# Patient Record
Sex: Female | Born: 1965 | Marital: Married | Smoking: Former smoker
Health system: Southern US, Community
[De-identification: ages and names within clinical notes are randomized; demographics above are authoritative.]

## PROBLEM LIST (undated history)

## (undated) DIAGNOSIS — N6001 Solitary cyst of right breast: Secondary | ICD-10-CM

## (undated) DIAGNOSIS — I10 Essential (primary) hypertension: Secondary | ICD-10-CM

## (undated) DIAGNOSIS — E785 Hyperlipidemia, unspecified: Secondary | ICD-10-CM

## (undated) DIAGNOSIS — G5603 Carpal tunnel syndrome, bilateral upper limbs: Secondary | ICD-10-CM

## (undated) DIAGNOSIS — G43909 Migraine, unspecified, not intractable, without status migrainosus: Secondary | ICD-10-CM

## (undated) HISTORY — PX: BREAST BIOPSY: SHX20

## (undated) HISTORY — DX: Carpal tunnel syndrome, bilateral upper limbs: G56.03

## (undated) HISTORY — DX: Solitary cyst of right breast: N60.01

## (undated) HISTORY — DX: Essential (primary) hypertension: I10

## (undated) HISTORY — PX: OTHER SURGICAL HISTORY: SHX169

## (undated) HISTORY — DX: Migraine, unspecified, not intractable, without status migrainosus: G43.909

## (undated) HISTORY — PX: APPENDECTOMY: SHX54

## (undated) HISTORY — DX: Hyperlipidemia, unspecified: E78.5

## (undated) HISTORY — PX: HIP SURGERY: SHX245

---

## 2020-10-17 ENCOUNTER — Other Ambulatory Visit: Payer: Self-pay | Admitting: Gerontology

## 2020-10-26 ENCOUNTER — Other Ambulatory Visit: Payer: Self-pay | Admitting: Gerontology

## 2020-10-26 DIAGNOSIS — N6310 Unspecified lump in the right breast, unspecified quadrant: Secondary | ICD-10-CM

## 2020-10-31 ENCOUNTER — Other Ambulatory Visit: Payer: Self-pay | Admitting: Gerontology

## 2020-10-31 ENCOUNTER — Telehealth: Payer: Self-pay | Admitting: Surgical

## 2020-10-31 DIAGNOSIS — N6310 Unspecified lump in the right breast, unspecified quadrant: Secondary | ICD-10-CM

## 2020-10-31 NOTE — Telephone Encounter (Signed)
LM for patient to return call. Patient has referral for Colposcopy. We do not have any records for this patient. We are needing a pap result.

## 2020-11-01 ENCOUNTER — Encounter: Payer: Self-pay | Admitting: Obstetrics and Gynecology

## 2020-11-01 ENCOUNTER — Other Ambulatory Visit: Payer: Self-pay

## 2020-11-01 ENCOUNTER — Other Ambulatory Visit (HOSPITAL_COMMUNITY)
Admission: RE | Admit: 2020-11-01 | Discharge: 2020-11-01 | Disposition: A | Discharge status: Home / Self Care | Payer: PRIVATE HEALTH INSURANCE | Source: Ambulatory Visit | Attending: Obstetrics and Gynecology | Admitting: Obstetrics and Gynecology

## 2020-11-01 ENCOUNTER — Ambulatory Visit (INDEPENDENT_AMBULATORY_CARE_PROVIDER_SITE_OTHER): Payer: No Typology Code available for payment source | Admitting: Obstetrics and Gynecology

## 2020-11-01 VITALS — BP 127/78 | HR 82 | Ht 64.0 in | Wt 276.0 lb

## 2020-11-01 DIAGNOSIS — Z8742 Personal history of other diseases of the female genital tract: Secondary | ICD-10-CM

## 2020-11-01 NOTE — Addendum Note (Signed)
Addended by: Silvano Bilis on: 11/01/2020 09:25 AM   Modules accepted: Orders

## 2020-11-01 NOTE — Progress Notes (Signed)
HPI:      Sheena Johnson is a 54 y.o. No obstetric history on file. who LMP was Patient's last menstrual period was 10/20/2020 (approximate).  Subjective:   [name]  Her last appointment in [place] she was told that she had a cervical abnormality that required colposcopy.  Patient does not have copies of her Pap smear from [place] and has been unable to get them despite signing a record release.  She is requesting examination of her cervix and possibly "starting over" with a new Pap smear to determine if colposcopy is actually necessary. She reports no abnormal history of Pap smears in her past.  She states no one has ever told her she had positive HPV. Patient is a former smoker who has not smoked in many years. She reports her period  as irregular and says that she has been having hot flashes.   She is specifically here today to discuss her cervical issues.   Hx: The following portions of the patient's history were reviewed and updated as appropriate:             She  has a past medical history of Carpal tunnel syndrome on both sides, Cyst (solitary) of breast, right, Hyperlipidemia, Hypertension, and Migraine. She does not have a problem list on file. She  has a past surgical history that includes left big toe; Hip surgery; Appendectomy; and Cesarean section. Her family history includes Breast cancer in her maternal aunt and maternal grandmother; Hypertension in her father. She  reports that she quit smoking about 8 years ago. She has never used smokeless tobacco. She reports current alcohol use. She reports that she does not use drugs. She has a current medication list which includes the following prescription(s): aspirin, cholecalciferol, eletriptan, hydrochlorothiazide, lovastatin, multi-vitamin, tamoxifen, and venlafaxine xr. She has No Known Allergies.       Review of Systems:  Review of Systems  Constitutional: Denied  constitutional symptoms, night sweats, recent illness, fatigue, fever, insomnia and weight loss.  Eyes: Denied eye symptoms, eye pain, photophobia, vision change and visual disturbance.  Ears/Nose/Throat/Neck: Denied ear, nose, throat or neck symptoms, hearing loss, nasal discharge, sinus congestion and sore throat.  Cardiovascular: Denied cardiovascular symptoms, arrhythmia, chest pain/pressure, edema, exercise intolerance, orthopnea and palpitations.  Respiratory: Denied pulmonary symptoms, asthma, pleuritic pain, productive sputum, cough, dyspnea and wheezing.  Gastrointestinal: Denied, gastro-esophageal reflux, melena, nausea and vomiting.  Genitourinary: See HPI for additional information.  Musculoskeletal: Denied musculoskeletal symptoms, stiffness, swelling, muscle weakness and myalgia.  Dermatologic: Denied dermatology symptoms, rash and scar.  Neurologic: Denied neurology symptoms, dizziness, headache, neck pain and syncope.  Psychiatric: Denied psychiatric symptoms, anxiety and depression.  Endocrine: Denied endocrine symptoms including hot flashes and night sweats.   Meds:   Current Outpatient Medications on File Prior to Visit  Medication Sig Dispense Refill  . aspirin 81 MG EC tablet Take by mouth.    . Cholecalciferol 25 MCG (1000 UT) tablet Take by mouth.    . eletriptan (RELPAX) 40 MG tablet Take by mouth.    . hydrochlorothiazide (HYDRODIURIL) 25 MG tablet Take by mouth.    . lovastatin (MEVACOR) 20 MG tablet Take by mouth.    . Multiple Vitamin (MULTI-VITAMIN) tablet Take 1 tablet by mouth daily.    . tamoxifen (NOLVADEX) 20 MG tablet Take by mouth.    . venlafaxine XR (EFFEXOR-XR) 150 MG 24 hr capsule Take by mouth.     No  current facility-administered medications on file prior to visit.          Objective:     Vitals:   11/01/20 0809  BP: 127/78  Pulse: 82   Filed Weights   11/01/20 0809  Weight: 276 lb (125.2 kg)              Physical examination    Pelvic:  Vulva: Normal appearance.  No lesions.  Vagina: No lesions or abnormalities noted.  Support: Normal pelvic support.  Urethra No masses tenderness or scarring.  Meatus Normal size without lesions or prolapse.  Cervix: Normal appearance.  No lesions.  Anus: Normal exam.  No lesions.  Perineum: Normal exam.  No lesions.   Pap smear performed.  Endocervix somewhat friable with some bleeding after Pap noted. No polyps or other cervical abnormalities noted.  Assessment:    No obstetric history on file. There are no problems to display for this patient.    1. History of abnormal cervical Pap smear     Unknown what this abnormality is.  Nothing noted today on speculum exam.   Plan:            1.  Pap cotest performed.  When results return we can decide future management if necessary.  2.  Briefly discussed menopause and skipping menstrual periods as well as hot flashes. Orders No orders of the defined types were placed in this encounter.   No orders of the defined types were placed in this encounter.     F/U  Return for We will contact her with any abnormal test results. I spent 31 minutes involved in the care of this patient preparing to see the patient by obtaining and reviewing her medical history (including labs, imaging tests and prior procedures), documenting clinical information in the electronic health record (EHR), counseling and coordinating care plans, writing and sending prescriptions, ordering tests or procedures and directly communicating with the patient by discussing pertinent items from her history and physical exam as well as detailing my assessment and plan as noted above so that she has an informed understanding.  All of her questions were answered.  Elonda Husky, M.D. 11/01/2020 8:57 AM

## 2020-11-01 NOTE — Addendum Note (Signed)
Addended by: Blair Heys on: 11/01/2020 09:46 AM   Modules accepted: Orders

## 2020-11-02 LAB — CYTOLOGY - PAP
Comment: NEGATIVE
Diagnosis: NEGATIVE
High risk HPV: NEGATIVE

## 2020-11-21 ENCOUNTER — Other Ambulatory Visit: Payer: Self-pay

## 2020-11-24 ENCOUNTER — Other Ambulatory Visit: Payer: Self-pay

## 2020-11-24 ENCOUNTER — Ambulatory Visit
Admission: RE | Admit: 2020-11-24 | Discharge: 2020-11-24 | Disposition: A | Discharge status: Home / Self Care | Payer: No Typology Code available for payment source | Source: Ambulatory Visit | Attending: Gerontology | Admitting: Gerontology

## 2020-11-24 DIAGNOSIS — N6310 Unspecified lump in the right breast, unspecified quadrant: Secondary | ICD-10-CM

## 2020-12-04 ENCOUNTER — Other Ambulatory Visit: Payer: Self-pay

## 2020-12-04 ENCOUNTER — Ambulatory Visit (INDEPENDENT_AMBULATORY_CARE_PROVIDER_SITE_OTHER): Payer: PRIVATE HEALTH INSURANCE

## 2020-12-04 ENCOUNTER — Encounter: Payer: Self-pay | Admitting: Emergency Medicine

## 2020-12-04 ENCOUNTER — Ambulatory Visit
Admission: EM | Admit: 2020-12-04 | Discharge: 2020-12-04 | Disposition: A | Discharge status: Home / Self Care | Payer: PRIVATE HEALTH INSURANCE | Attending: Sports Medicine | Admitting: Sports Medicine

## 2020-12-04 DIAGNOSIS — M7989 Other specified soft tissue disorders: Secondary | ICD-10-CM

## 2020-12-04 DIAGNOSIS — S8002XA Contusion of left knee, initial encounter: Secondary | ICD-10-CM

## 2020-12-04 DIAGNOSIS — W19XXXA Unspecified fall, initial encounter: Secondary | ICD-10-CM

## 2020-12-04 DIAGNOSIS — M25562 Pain in left knee: Secondary | ICD-10-CM | POA: Diagnosis not present

## 2020-12-04 DIAGNOSIS — S8992XA Unspecified injury of left lower leg, initial encounter: Secondary | ICD-10-CM

## 2020-12-04 DIAGNOSIS — M25462 Effusion, left knee: Secondary | ICD-10-CM | POA: Diagnosis not present

## 2020-12-04 NOTE — ED Provider Notes (Signed)
[facility]    CSN: [id number] Arrival date & time: 12/04/20  1056      History   Chief Complaint Chief Complaint  Patient presents with  . Knee Pain    left    HPI Sheena Johnson is a 54 y.o. female.   Pleasant 54 year old female who presents for evaluation of an injury to her left lower extremity.  Patient reports she was coming out of her house this morning around 7:20 AM and was heading to work and slipped on some frost.  She says that her left leg went underneath her and it went into hyperflexion.  She did not feel or hear a pop.  She does have some swelling in her knee emanating down to her ankle and in her calf area.  No numbness or tingling.  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  Around 945 the pain got so bad and she came here to the urgent care for evaluation and management.  She denies any chronic problems with this leg.  No red flag signs or symptoms.  Denies back pain.       Past Medical History:  Diagnosis Date  . Carpal tunnel syndrome on both sides   . Cyst (solitary) of breast, right   . Hyperlipidemia   . Hypertension   . Migraine     There are no problems to display for this patient.   Past Surgical History:  Procedure Laterality Date  . APPENDECTOMY    . BREAST BIOPSY    . CESAREAN SECTION    . HIP SURGERY    . left big toe      OB History   No obstetric history on file.      Home Medications    Prior to Admission medications   Medication Sig Start Date End Date Taking? Authorizing Provider  Cholecalciferol 25 MCG (1000 UT) tablet Take by mouth.   Yes [provider]  eletriptan (RELPAX) 40 MG tablet Take by mouth.   Yes [provider]  hydrochlorothiazide (HYDRODIURIL) 25 MG tablet Take by mouth.   Yes [provider]  lovastatin (MEVACOR) 20 MG tablet Take by mouth.   Yes [provider]  Multiple Vitamin (MULTI-VITAMIN) tablet Take 1  tablet by mouth daily.   Yes [provider]  venlafaxine XR (EFFEXOR-XR) 150 MG 24 hr capsule Take by mouth. 09/15/20 09/15/21 Yes [provider]  aspirin 81 MG EC tablet Take by mouth.    [provider]  tamoxifen (NOLVADEX) 20 MG tablet Take by mouth.    [provider]    Family History Family History  Problem Relation Age of Onset  . Hypertension Father   . Breast cancer Maternal Aunt   . Breast cancer Maternal Grandmother   . Breast cancer Cousin     Social History Social History   Tobacco Use  . Smoking status: Former Smoker    Quit date: 07/01/2012    Years since quitting: 8.4  . Smokeless tobacco: Never Used  Vaping Use  . Vaping Use: Never used  Substance Use Topics  . Alcohol use: Yes    Comment: occasional  . Drug use: Never     Allergies   Patient has no known allergies.   Review of Systems Review of Systems  Musculoskeletal: Positive for gait problem and joint swelling.       Positive for left lower extremity swelling and ecchymosis  All other  systems reviewed and are negative.    Physical Exam Triage Vital Signs ED Triage Vitals  Enc Vitals Group     BP 12/04/20 1159 (!) 159/114     Pulse Rate 12/04/20 1159 (!) 103     Resp 12/04/20 1159 18     Temp 12/04/20 1159 98.4 F (36.9 C)     Temp Source 12/04/20 1159 Oral     SpO2 12/04/20 1159 100 %     Weight 12/04/20 1157 276 lb 0.3 oz (125.2 kg)     Height 12/04/20 1157 5\' 4"  (1.626 m)     Head Circumference --      Peak Flow --      Pain Score 12/04/20 1157 5     Pain Loc --      Pain Edu? --      Excl. in GC? --    No data found.  Updated Vital Signs BP (!) 159/114 (BP Location: Left Arm)   Pulse (!) 103   Temp 98.4 F (36.9 C) (Oral)   Resp 18   Ht 5\' 4"  (1.626 m)   Wt 125.2 kg   SpO2 100%   BMI 47.38 kg/m   Visual Acuity Right Eye Distance:   Left Eye Distance:   Bilateral Distance:    Right Eye Near:   Left Eye Near:    Bilateral  Near:     Physical Exam  General: Alert, pleasant, no acute distress.  She is uncomfortable.  She has an antalgic gait pattern favoring that left leg. Right knee and lower extremity: normal to inspection palpation range of motion special test Left knee and lower extremity: Patient has some obvious soft tissue swelling from the knee down to the ankle.  There is some early ecchymosis.  There is fullness within the calf region with global tenderness to palpation.  There is a knee effusion and limitation with examination.  She is tender over the patellar tendon.  She is able to extend her knee against gravity and the quadriceps tendon is intact.  There is good patellar mobility.  There is an effusion noted and the patella is mildly ballotable.  Unable to fully assess the anterior cruciate ligament or posterior cruciate ligament.  Varus and valgus stress testing is within normal limits. Neurovascular: Normal sensation 2+ pulses distally. UC Treatments / Results  Labs (all labs ordered are listed, but only abnormal results are displayed) Labs Reviewed - No data to display  EKG   Radiology DG Tibia/Fibula Left  Result Date: 12/04/2020 CLINICAL DATA:  Left knee pain and swelling. EXAM: LEFT TIBIA AND FIBULA - 2 VIEW COMPARISON:  No prior. FINDINGS: Diffuse soft tissue swelling. No acute bony or joint abnormality. No evidence of fracture or dislocation. IMPRESSION: Diffuse soft tissue swelling. No acute bony abnormality. Electronically Signed   By:  Register   On: 12/04/2020 13:49   DG Knee Complete 4 Views Left  Result Date: 12/04/2020 CLINICAL DATA:  Fall, pain and swelling EXAM: LEFT KNEE - COMPLETE 4+ VIEW COMPARISON:  None. FINDINGS: Alignment is anatomic. There is no acute fracture. A joint effusion is present without fluid-fluid level. Joint spaces are preserved. Mild lateral compartment marginal spurring. IMPRESSION: No acute fracture. Joint effusion. Electronically Signed   By:  12/06/2020 M.D.   On: 12/04/2020 13:51    Procedures Procedures (including critical care time)  Medications Ordered in UC Medications - No data to display  Initial Impression / Assessment and Plan / UC Course  I have reviewed the triage vital signs and the nursing notes.  Pertinent labs & imaging results that were available during my care of the patient were reviewed by me and considered in my medical decision making (see chart for details).    Clinical impression:   left lower extremity injury with limited exam.  Patient does have some significant ecchymosis and swelling from the knee all the way to the ankle.  There is a joint effusion as well.  Concerning is the amount of swelling that she has in the calf region and she may be developing some early compartment syndrome.  Treatment plan: 1.  The findings and treatment plan were discussed in detail the patient.  The patient was in agreement.  2. I reviewed the x-rays with the patient in detail.  There is no obvious fracture.  There is soft tissue swelling and a joint effusion.  I recommended supportive care for now which would include icing and elevation.  Also compression stockings to try to decrease the spread of the swelling into her calf. 3.  I had a long discussion with her regarding the possibility of compartment syndrome and when to go to the emergency room.  She voiced verbal understanding. 4.  I have asked her to stay away from any blood thinners including aspirin and any NSAIDs.  She can use Tylenol as needed.  Also discussed with her the fact that with her swelling she may develop compartment syndrome.  We discussed what to look for and when to seek out immediate medical attention.  She voiced verbal understanding.  Although I recommended not feeding the swelling, which clearly is due to some bleeding, I did indicate that she was at high risk given her trauma and where the swelling was of developing a DVT in in that calf.  We  discussed the red flag signs and symptoms of that and when to seek out immediate medical attention.  Again she voiced verbal understanding. 5. I offered a work note but she said she did not need it in her position.  She will just use her discretion as to when she should return to work.  I indicated that she should be able to ambulate fairly normally before doing so.  At this time she does not need crutches but when she goes and gets the compression stocking she may look at a cane which she should use in her right hand. 6.  We will refer her to orthopedics for follow-up evaluation and just to follow her along and make sure she is going in the right direction.  The information was given to her and her discharge paperwork. 7.  Follow-up here as needed.   Final Clinical Impressions(s) / UC Diagnoses   Final diagnoses:  Left knee injury, initial encounter  Effusion of left knee joint  Swelling of calf     Discharge Instructions     I reviewed the x-rays with the patient in detail.  There is no obvious fracture.  There is soft tissue swelling and a joint effusion.  I recommended supportive care for now which would include icing and elevation.  Also compression stockings to try to decrease the spread of the swelling into her calf. I had a long discussion with her regarding the possibility of compartment syndrome and when to go to the emergency room.  She voiced verbal understanding. I have asked her to stay away from any blood thinners including aspirin and any NSAIDs.  She can use Tylenol as  needed. We will refer her to orthopedics for follow-up evaluation and just to follow her along and make sure she is going in the right direction.  The information was given to her and her discharge paperwork.    ED Prescriptions    None     PDMP not reviewed this encounter.   Delton See, MD 12/05/20 [phone]

## 2020-12-04 NOTE — ED Triage Notes (Signed)
Pt c/o left knee pain and swelling. She states she fell down the stairs at her home this morning. She states the pain radiates into her lower leg as well.

## 2020-12-04 NOTE — Discharge Instructions (Addendum)
I reviewed the x-rays with the patient in detail.  There is no obvious fracture.  There is soft tissue swelling and a joint effusion.  I recommended supportive care for now which would include icing and elevation.  Also compression stockings to try to decrease the spread of the swelling into her calf. I had a long discussion with her regarding the possibility of compartment syndrome and when to go to the emergency room.  She voiced verbal understanding.  We also discussed that she is at high risk for DVT and what to watch for. I have asked her to stay away from any blood thinners including aspirin and any NSAIDs.  She can use Tylenol as needed. We will refer her to orthopedics for follow-up evaluation and just to follow her along and make sure she is going in the right direction.  The information was given to her and her discharge paperwork.

## 2020-12-10 ENCOUNTER — Emergency Department: Payer: No Typology Code available for payment source

## 2020-12-10 ENCOUNTER — Other Ambulatory Visit: Payer: Self-pay

## 2020-12-10 ENCOUNTER — Emergency Department
Admission: EM | Admit: 2020-12-10 | Discharge: 2020-12-10 | Disposition: A | Discharge status: Home / Self Care | Payer: No Typology Code available for payment source | Attending: Emergency Medicine | Admitting: Emergency Medicine

## 2020-12-10 ENCOUNTER — Encounter: Payer: Self-pay | Admitting: Emergency Medicine

## 2020-12-10 DIAGNOSIS — T148XXA Other injury of unspecified body region, initial encounter: Secondary | ICD-10-CM

## 2020-12-10 DIAGNOSIS — S83242A Other tear of medial meniscus, current injury, left knee, initial encounter: Secondary | ICD-10-CM | POA: Insufficient documentation

## 2020-12-10 DIAGNOSIS — I10 Essential (primary) hypertension: Secondary | ICD-10-CM | POA: Diagnosis not present

## 2020-12-10 DIAGNOSIS — R2242 Localized swelling, mass and lump, left lower limb: Secondary | ICD-10-CM | POA: Insufficient documentation

## 2020-12-10 DIAGNOSIS — Z7982 Long term (current) use of aspirin: Secondary | ICD-10-CM | POA: Diagnosis not present

## 2020-12-10 DIAGNOSIS — Z79899 Other long term (current) drug therapy: Secondary | ICD-10-CM | POA: Diagnosis not present

## 2020-12-10 DIAGNOSIS — W108XXA Fall (on) (from) other stairs and steps, initial encounter: Secondary | ICD-10-CM | POA: Diagnosis not present

## 2020-12-10 DIAGNOSIS — M7989 Other specified soft tissue disorders: Secondary | ICD-10-CM

## 2020-12-10 DIAGNOSIS — Z87891 Personal history of nicotine dependence: Secondary | ICD-10-CM | POA: Insufficient documentation

## 2020-12-10 DIAGNOSIS — S8992XA Unspecified injury of left lower leg, initial encounter: Secondary | ICD-10-CM | POA: Diagnosis present

## 2020-12-10 LAB — CBC WITH DIFFERENTIAL/PLATELET
Abs Immature Granulocytes: 0.02 10*3/uL (ref 0.00–0.07)
Basophils Absolute: 0 10*3/uL (ref 0.0–0.1)
Basophils Relative: 0 %
Eosinophils Absolute: 0 10*3/uL (ref 0.0–0.5)
Eosinophils Relative: 0 %
HCT: 37.7 % (ref 36.0–46.0)
Hemoglobin: 12.2 g/dL (ref 12.0–15.0)
Immature Granulocytes: 0 %
Lymphocytes Relative: 28 %
Lymphs Abs: 2.6 10*3/uL (ref 0.7–4.0)
MCH: 28.9 pg (ref 26.0–34.0)
MCHC: 32.4 g/dL (ref 30.0–36.0)
MCV: 89.3 fL (ref 80.0–100.0)
Monocytes Absolute: 0.6 10*3/uL (ref 0.1–1.0)
Monocytes Relative: 6 %
Neutro Abs: 6 10*3/uL (ref 1.7–7.7)
Neutrophils Relative %: 66 %
Platelets: 265 10*3/uL (ref 150–400)
RBC: 4.22 MIL/uL (ref 3.87–5.11)
RDW: 13.8 % (ref 11.5–15.5)
WBC: 9.3 10*3/uL (ref 4.0–10.5)
nRBC: 0 % (ref 0.0–0.2)

## 2020-12-10 LAB — BASIC METABOLIC PANEL
Anion gap: 7 (ref 5–15)
BUN: 17 mg/dL (ref 6–20)
CO2: 26 mmol/L (ref 22–32)
Calcium: 10.1 mg/dL (ref 8.9–10.3)
Chloride: 104 mmol/L (ref 98–111)
Creatinine, Ser: 0.37 mg/dL — ABNORMAL LOW (ref 0.44–1.00)
GFR, Estimated: >60 mL/min (ref >60)
Glucose, Bld: 130 mg/dL — ABNORMAL HIGH (ref 70–99)
Potassium: 3.5 mmol/L (ref 3.5–5.1)
Sodium: 137 mmol/L (ref 135–145)

## 2020-12-10 MED ORDER — LORAZEPAM 1 MG PO TABS
1.0000 mg | ORAL_TABLET | Freq: Once | ORAL | Status: AC
  Administered 2020-12-10: 14:00:00 1 mg via ORAL
  Filled 2020-12-10: qty 1

## 2020-12-10 MED ORDER — IPRATROPIUM-ALBUTEROL 0.5-2.5 (3) MG/3ML IN SOLN: 3.0000 mL | Freq: Once | RESPIRATORY_TRACT | Status: DC

## 2020-12-10 MED ORDER — TRAMADOL HCL 50 MG PO TABS
50.0000 mg | ORAL_TABLET | Freq: Four times a day (QID) | ORAL | 0 refills | Status: AC | PRN
Start: 2020-12-10 — End: ?

## 2020-12-10 NOTE — ED Triage Notes (Signed)
Pt to ED via POV, pt states that she fell 1 week ago. Pt was seen at urgent care and x-rays were done and nothing was broke. Since then pt has develop more pain and extensive bruising. Pt denies using blood thinners. Pt states that swelling has gotten worse over the past week as well.

## 2020-12-10 NOTE — ED Provider Notes (Signed)
[facility] Emergency Department Provider Note  ____________________________________________   Event Date/Time   First MD Initiated Contact with Patient 12/10/20 1206     (approximate)  I have reviewed the triage vital signs and the nursing notes.   HISTORY  Chief Complaint Leg Pain and Bleeding/Bruising    HPI Sheena Johnson is a 54 y.o. female presents emergency department stating she fell down porch steps 1 week ago.  Fell approximately 5 steps.  Went to the urgent care had an x-ray of her left knee and tib-fib which were negative.  Patient states she has had a lots of bruising and swelling in the lower extremity.  No numbness.  States she can still feel her toes.  Was concerned of a blood clot.  Patient states has excruciating pain when she tries to walk on the left lower extremity.    Past Medical History:  Diagnosis Date  . Carpal tunnel syndrome on both sides   . Cyst (solitary) of breast, right   . Hyperlipidemia   . Hypertension   . Migraine     There are no problems to display for this patient.   Past Surgical History:  Procedure Laterality Date  . APPENDECTOMY    . BREAST BIOPSY    . CESAREAN SECTION    . HIP SURGERY    . left big toe      Prior to Admission medications   Medication Sig Start Date End Date Taking? Authorizing Provider  aspirin 81 MG EC tablet Take by mouth.    [provider]  Cholecalciferol 25 MCG (1000 UT) tablet Take by mouth.    [provider]  eletriptan (RELPAX) 40 MG tablet Take by mouth.    [provider]  hydrochlorothiazide (HYDRODIURIL) 25 MG tablet Take by mouth.    [provider]  lovastatin (MEVACOR) 20 MG tablet Take by mouth.    [provider]  Multiple Vitamin (MULTI-VITAMIN) tablet Take 1 tablet by mouth daily.    [provider]  tamoxifen (NOLVADEX) 20 MG tablet Take by mouth.    [provider]  traMADol (ULTRAM) 50 MG tablet  Take 1 tablet (50 mg total) by mouth every 6 (six) hours as needed. 12/10/20   Sherrie Mustache Roselyn Bering, PA-C  venlafaxine XR (EFFEXOR-XR) 150 MG 24 hr capsule Take by mouth. 09/15/20 09/15/21  [provider]    Allergies Patient has no known allergies.  Family History  Problem Relation Age of Onset  . Hypertension Father   . Breast cancer Maternal Aunt   . Breast cancer Maternal Grandmother   . Breast cancer Cousin     Social History Social History   Tobacco Use  . Smoking status: Former Smoker    Quit date: 07/01/2012    Years since quitting: 8.4  . Smokeless tobacco: Never Used  Vaping Use  . Vaping Use: Never used  Substance Use Topics  . Alcohol use: Yes    Comment: occasional  . Drug use: Never    Review of Systems  Constitutional: No fever/chills Eyes: No visual changes. ENT: No sore throat. Respiratory: Denies cough Cardiovascular: Denies chest pain Abdominal, no abdominal pain Genitourinary: Negative for dysuria. Musculoskeletal: Negative for back pain. Skin: Negative for rash. Psychiatric: no mood changes,     ____________________________________________   PHYSICAL EXAM:  VITAL SIGNS: ED Triage Vitals  Enc Vitals Group     BP 12/10/20 0838 (!) 142/76     Pulse Rate 12/10/20 0838 88  Resp 12/10/20 0838 16     Temp 12/10/20 0838 98.9 F (37.2 C)     Temp Source 12/10/20 0838 Oral     SpO2 12/10/20 0838 100 %     Weight 12/10/20 0839 260 lb (117.9 kg)     Height 12/10/20 0839 5\' 4"  (1.626 m)     Head Circumference --      Peak Flow --      Pain Score 12/10/20 0838 6     Pain Loc --      Pain Edu? --      Excl. in GC? --     Constitutional: Alert and oriented. Well appearing and in no acute distress. Eyes: Conjunctivae are normal.  Head: Atraumatic. Nose: No congestion/rhinnorhea. Mouth/Throat: Mucous membranes are moist.   Neck:  supple no lymphadenopathy noted Cardiovascular: Normal rate, regular rhythm.  Respiratory: Normal  respiratory effort.  No retractions,d GU: deferred Musculoskeletal: FROM all extremities, warm and well perfused, left lower leg is 2 times the size of the right, large amount of bruising noted throughout the entire leg, was unable to feel the distal pulse, use the Doppler which shows full pulses. Neurologic:  Normal speech and language.  Skin:  Skin is warm, dry and intact. No rash noted. Psychiatric: Mood and affect are normal. Speech and behavior are normal.  ____________________________________________   LABS (all labs ordered are listed, but only abnormal results are displayed)  Labs Reviewed  BASIC METABOLIC PANEL - Abnormal; Notable for the following components:      Result Value   Glucose, Bld 130 (*)    Creatinine, Ser 0.37 (*)    All other components within normal limits  CBC WITH DIFFERENTIAL/PLATELET   ____________________________________________   ____________________________________________  RADIOLOGY  Ultrasound left lower extremity for DVT X-ray left lower extremity MRI left lower extremity  ____________________________________________   PROCEDURES  Procedure(s) performed: No  Procedures    ____________________________________________   INITIAL IMPRESSION / ASSESSMENT AND PLAN / ED COURSE  Pertinent labs & imaging results that were available during my care of the patient were reviewed by me and considered in my medical decision making (see chart for details).   Patient is a 54 year old female presents emergency department after a fall.  Leg went up underneath her car approximately 1 week ago.  Patient said continued bruising swelling and pain.  See HPI.  Physical exam shows extensive bruising and tenderness along the lower extremity.  DDx: DVT, arterial injury, large hematoma, compartment syndrome, stress fracture  CBC and metabolic panel are normal, x-ray of the left tib-fib which was reviewed by me and confirmed by radiology is negative for any  acute fracture, ultrasound of left lower extremity is not show DVT.  Due to the amount of bruising and swelling will order MRI of the left tib-fib. Patient will be given Ativan 1 mg p.o. prior to MRI   MRI shows multiple hematomas and some edema, possible meniscus tear  I did explain the findings to the patient.  The knee immobilizer was going to be applied by nursing staff but the knee immobilizer does not fit the patient.  She was given a Jones wrap.  She was given a walker to not bear weight on the area.  Is given a work note she is to stay home and elevate the area.  To ice packs.  [facility] as she already has an appointment on Wednesday.  She was given a prescription of tramadol and discharged  in stable condition  Lind Ausley was evaluated in Emergency Department on 12/10/2020 for the symptoms described in the history of present illness. She was evaluated in the context of the global COVID-19 pandemic, which necessitated consideration that the patient might be at risk for infection with the SARS-CoV-2 virus that causes COVID-19. Institutional protocols and algorithms that pertain to the evaluation of patients at risk for COVID-19 are in a state of rapid change based on information released by regulatory bodies including the CDC and federal and state organizations. These policies and algorithms were followed during the patient's care in the ED.    As part of my medical decision making, I reviewed the following data within the electronic MEDICAL RECORD NUMBER Nursing notes reviewed and incorporated, Labs reviewed , Old chart reviewed, Radiograph reviewed , Notes from prior ED visits and [place] Controlled Substance Database  ____________________________________________   FINAL CLINICAL IMPRESSION(S) / ED DIAGNOSES  Final diagnoses:  Pain and swelling of left lower leg  Hematoma  Acute medial meniscus tear of left knee, initial encounter      NEW MEDICATIONS  STARTED DURING THIS VISIT:  Discharge Medication List as of 12/10/2020  3:08 PM    START taking these medications   Details  traMADol (ULTRAM) 50 MG tablet Take 1 tablet (50 mg total) by mouth every 6 (six) hours as needed., Starting Sun 12/10/2020, Normal         Note:  This document was prepared using Dragon voice recognition software and may include unintentional dictation errors.    Faythe Ghee, PA-C 12/10/20 1854    Sharman Cheek, MD 12/11/20 1254

## 2020-12-10 NOTE — ED Notes (Signed)
Knee immobilizer in ED is not large enough for this pt - Discussed with D[facility] and Jones wrap placed to left leg/knee

## 2020-12-10 NOTE — ED Notes (Signed)
Pt reports that she fell down porch steps x1 week ago - She went had had xray of left knee and tib/fib - She was set for ortho appt next Wed - Pt reports that the bruising on her leg has increased and she is concerned about "compartment syndrome" because of the "hardness in her leg" - Pt reports pain has increased in left lower ext and she noted warmness to the area x2 days

## 2020-12-10 NOTE — Discharge Instructions (Signed)
Follow-up with [facility] for your already scheduled appointment Return to the emergency department worsening Apply ice as much as possible to the lower leg. Wear the knee immobilizer Use a walker to be nonweightbearing.

## 2021-04-19 ENCOUNTER — Ambulatory Visit: Payer: No Typology Code available for payment source | Admitting: Family Medicine

## 2021-05-10 ENCOUNTER — Ambulatory Visit (INDEPENDENT_AMBULATORY_CARE_PROVIDER_SITE_OTHER): Payer: Self-pay | Admitting: Family Medicine

## 2021-05-10 ENCOUNTER — Encounter: Payer: Self-pay | Admitting: Family Medicine

## 2021-05-10 ENCOUNTER — Other Ambulatory Visit: Payer: Self-pay

## 2021-05-10 VITALS — BP 137/82 | HR 84 | Ht 64.0 in | Wt 283.2 lb

## 2021-05-10 DIAGNOSIS — Z7689 Persons encountering health services in other specified circumstances: Secondary | ICD-10-CM | POA: Insufficient documentation

## 2021-05-10 DIAGNOSIS — E66813 Obesity, class 3: Secondary | ICD-10-CM | POA: Insufficient documentation

## 2021-05-10 DIAGNOSIS — Z6841 Body Mass Index (BMI) 40.0 and over, adult: Secondary | ICD-10-CM | POA: Diagnosis not present

## 2021-05-10 NOTE — Progress Notes (Signed)
Established Patient Office Visit  SUBJECTIVE:  Subjective  Patient ID: Sheena Johnson, female    DOB: 1966-12-03  Age: 55 y.o. MRN: [id number]  CC: No chief complaint on file.   HPI Sheena Johnson is a 55 y.o. female presenting today for     Past Medical History:  Diagnosis Date  . Carpal tunnel syndrome on both sides   . Cyst (solitary) of breast, right   . Hyperlipidemia   . Hypertension   . Migraine     Past Surgical History:  Procedure Laterality Date  . APPENDECTOMY    . BREAST BIOPSY    . CESAREAN SECTION    . HIP SURGERY    . left big toe      Family History  Problem Relation Age of Onset  . Hypertension Father   . Breast cancer Maternal Aunt   . Breast cancer Maternal Grandmother   . Breast cancer Cousin     Social History   Socioeconomic History  . Marital status: Married    Spouse name: Not on file  . Number of children: Not on file  . Years of education: Not on file  . Highest education level: Not on file  Occupational History  . Not on file  Tobacco Use  . Smoking status: Former Smoker    Quit date: 07/01/2012    Years since quitting: 8.8  . Smokeless tobacco: Never Used  Vaping Use  . Vaping Use: Never used  Substance and Sexual Activity  . Alcohol use: Yes    Comment: occasional  . Drug use: Never  . Sexual activity: Yes    Birth control/protection: None  Other Topics Concern  . Not on file  Social History Narrative  . Not on file   Social Determinants of Health   Financial Resource Strain: Not on file  Food Insecurity: Not on file  Transportation Needs: Not on file  Physical Activity: Not on file  Stress: Not on file  Social Connections: Not on file  Intimate Partner Violence: Not on file     Current Outpatient Medications:  .  Cholecalciferol 25 MCG (1000 UT) tablet, Take by mouth., Disp: , Rfl:  .  eletriptan (RELPAX) 40 MG tablet, Take by mouth., Disp: , Rfl:  .  hydrochlorothiazide (HYDRODIURIL) 25 MG tablet, Take by  mouth., Disp: , Rfl:  .  lovastatin (MEVACOR) 20 MG tablet, Take by mouth., Disp: , Rfl:  .  Multiple Vitamin (MULTI-VITAMIN) tablet, Take 1 tablet by mouth daily., Disp: , Rfl:  .  traMADol (ULTRAM) 50 MG tablet, Take 1 tablet (50 mg total) by mouth every 6 (six) hours as needed., Disp: 15 tablet, Rfl: 0 .  venlafaxine XR (EFFEXOR-XR) 150 MG 24 hr capsule, Take by mouth., Disp: , Rfl:    No Known Allergies  ROS Review of Systems  Constitutional: Negative.   HENT: Negative.   Respiratory: Negative.   Cardiovascular: Negative.   Gastrointestinal: Negative.   Genitourinary: Negative.   Psychiatric/Behavioral: Negative.      OBJECTIVE:    Physical Exam Constitutional:      Appearance: She is obese.  HENT:     Right Ear: Tympanic membrane normal.     Left Ear: Tympanic membrane normal.  Cardiovascular:     Rate and Rhythm: Normal rate and regular rhythm.  Neurological:     General: No focal deficit present.  Psychiatric:        Mood and Affect: Mood normal.     BP 137/82  Pulse 84   Ht 5\' 4"  (1.626 m)   Wt 283 lb 3.2 oz (128.5 kg)   BMI 48.61 kg/m  Wt Readings from Last 3 Encounters:  05/10/21 283 lb 3.2 oz (128.5 kg)  12/10/20 260 lb (117.9 kg)  12/04/20 276 lb 0.3 oz (125.2 kg)    Health Maintenance Due  Topic Date Due  . HIV Screening  Never done  . Hepatitis C Screening  Never done  . TETANUS/TDAP  Never done  . COLONOSCOPY (Pts 45-48yrs Insurance coverage will need to be confirmed)  Never done  . MAMMOGRAM  Never done  . Zoster Vaccines- Shingrix (1 of 2) Never done  . COVID-19 Vaccine (3 - Pfizer risk 4-dose series) 02/27/2020    There are no preventive care reminders to display for this patient.  CBC Latest Ref Rng & Units 12/10/2020  WBC 4.0 - 10.5 K/uL 9.3  Hemoglobin 12.0 - 15.0 g/dL 12/12/2020  Hematocrit 38.4 - 46.0 % 37.7  Platelets 150 - 400 K/uL 265   CMP Latest Ref Rng & Units 12/10/2020  Glucose 70 - 99 mg/dL 12/12/2020)  BUN 6 - 20 mg/dL 17   Creatinine 993(T - 1.00 mg/dL 7.01)  Sodium 7.79(T - 903 mmol/L 137  Potassium 3.5 - 5.1 mmol/L 3.5  Chloride 98 - 111 mmol/L 104  CO2 22 - 32 mmol/L 26  Calcium 8.9 - 10.3 mg/dL 009    No results found for: TSH Lab Results  Component Value Date   ANIONGAP 7 12/10/2020   No results found for: CHOL, HDL, LDLCALC, CHOLHDL No results found for: TRIG No results found for: HGBA1C    ASSESSMENT & PLAN:   Problem List Items Addressed This Visit      Other   Encounter to establish care - Primary   Class 3 severe obesity due to excess calories without serious comorbidity with body mass index (BMI) of 40.0 to 44.9 in adult [facility])    Discussed Med Diet, portion control. FU Physical in 1 month.          No orders of the defined types were placed in this encounter.     Follow-up: No follow-ups on file.    [facility], FNP [facility] [address] [place], [place]

## 2021-05-10 NOTE — Assessment & Plan Note (Signed)
Discussed Med Diet, portion control. FU Physical in 1 month.

## 2021-06-07 ENCOUNTER — Ambulatory Visit: Payer: 59 | Admitting: Family Medicine

## 2021-08-21 ENCOUNTER — Other Ambulatory Visit: Payer: Self-pay

## 2021-08-21 MED ORDER — ELETRIPTAN HYDROBROMIDE 40 MG PO TABS: 40.0000 mg | ORAL_TABLET | Freq: Once | ORAL | 1 refills | Status: DC

## 2021-08-21 MED ORDER — HYDROCHLOROTHIAZIDE 25 MG PO TABS: 25.0000 mg | ORAL_TABLET | Freq: Every day | ORAL | 3 refills | Status: DC

## 2021-08-21 MED ORDER — LOVASTATIN 20 MG PO TABS: 20.0000 mg | ORAL_TABLET | Freq: Every day | ORAL | 3 refills | Status: AC

## 2021-11-18 ENCOUNTER — Other Ambulatory Visit: Payer: Self-pay | Admitting: Internal Medicine

## 2021-11-19 ENCOUNTER — Other Ambulatory Visit: Payer: Self-pay

## 2021-11-19 MED ORDER — ELETRIPTAN HYDROBROMIDE 40 MG PO TABS: 40.0000 mg | ORAL_TABLET | Freq: Once | ORAL | 0 refills | Status: DC

## 2021-11-19 MED ORDER — VENLAFAXINE HCL ER 150 MG PO CP24: 150.0000 mg | ORAL_CAPSULE | Freq: Every day | ORAL | 0 refills | Status: DC

## 2021-11-27 ENCOUNTER — Ambulatory Visit: Payer: Self-pay | Admitting: Internal Medicine

## 2021-12-17 ENCOUNTER — Other Ambulatory Visit: Payer: Self-pay | Admitting: Internal Medicine

## 2022-01-25 ENCOUNTER — Other Ambulatory Visit: Payer: Self-pay | Admitting: *Deleted

## 2022-01-25 MED ORDER — ELETRIPTAN HYDROBROMIDE 40 MG PO TABS
40.0000 mg | ORAL_TABLET | Freq: Once | ORAL | 0 refills | Status: AC
Start: 2021-12-11 — End: 2021-12-11

## 2022-02-10 IMAGING — US US BREAST*R* LIMITED INC AXILLA
1 series · 6 of 6 positions shown · non-contrast
Comparison: Bilateral diagnostic mammogram and bilateral breast
ultrasound dated 02/21/2020 at the [REDACTED] in [REDACTED], [place].

CLINICAL DATA: Follow-up probably benign mass seen in the 9 o'clock
position of the right breast elsewhere in February 2020. She also had a
benign appearing cyst in the 8:30 o'clock position of the right
breast at that time as well as benign left breast cysts. Family
history of breast cancer in her maternal grandmother, maternal aunt
and maternal cousin. She is taking preventative tamoxifen for 5
years.

EXAM:
DIGITAL DIAGNOSTIC RIGHT MAMMOGRAM WITH CAD AND TOMO
ULTRASOUND RIGHT BREAST

[Series 1: us breast*right* limited inc axilla · 0.06mm/px · 6 of 6 slices shown]
[im 1/6]
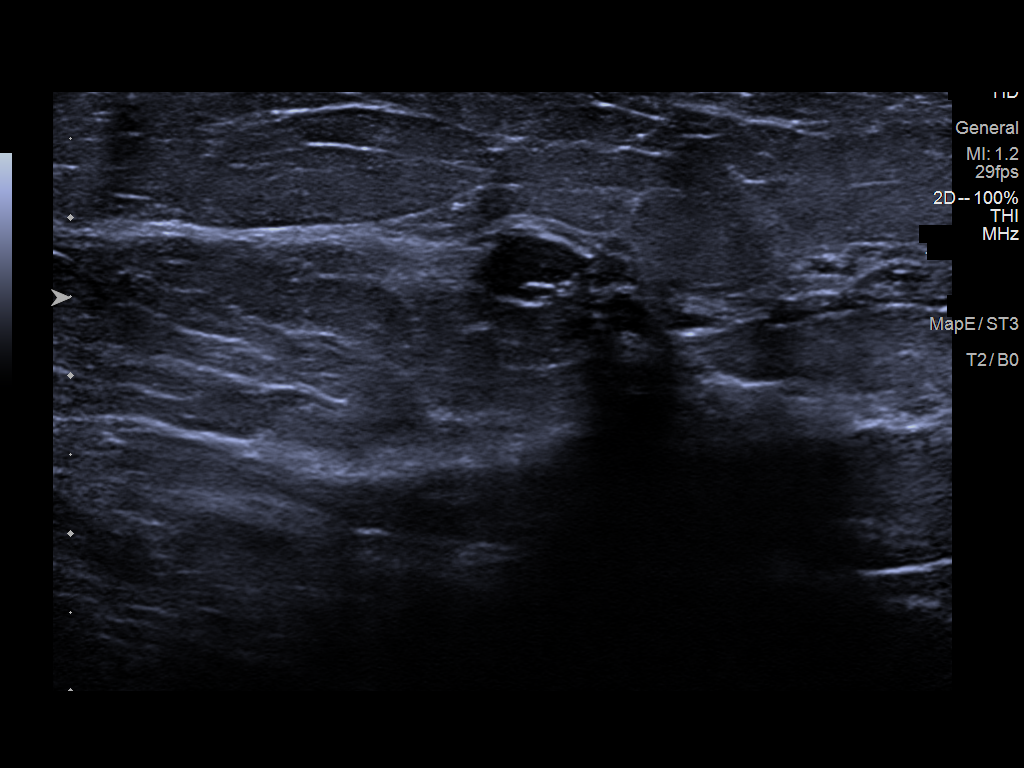
[im 2/6]
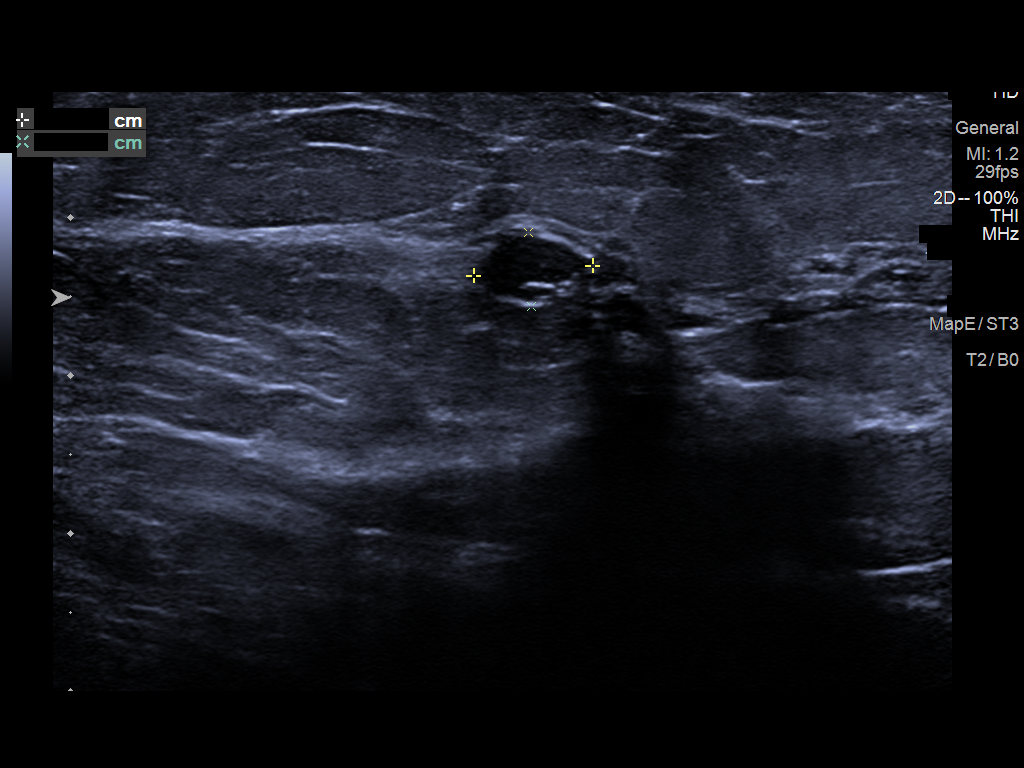
[im 3/6]
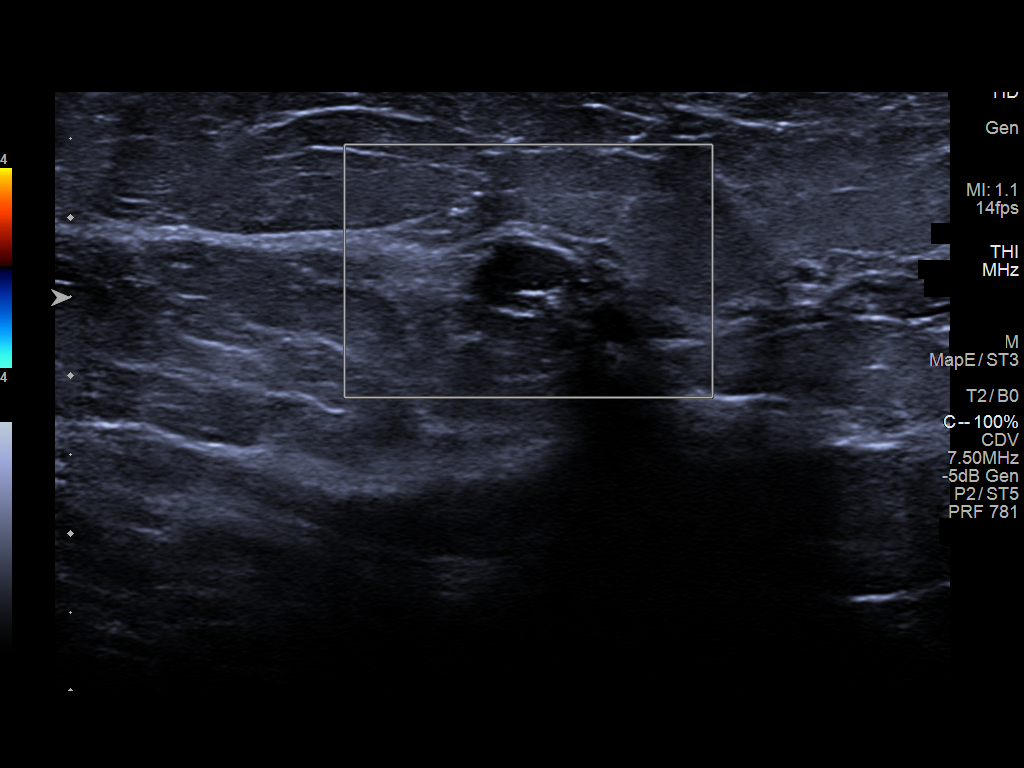
[im 4/6]
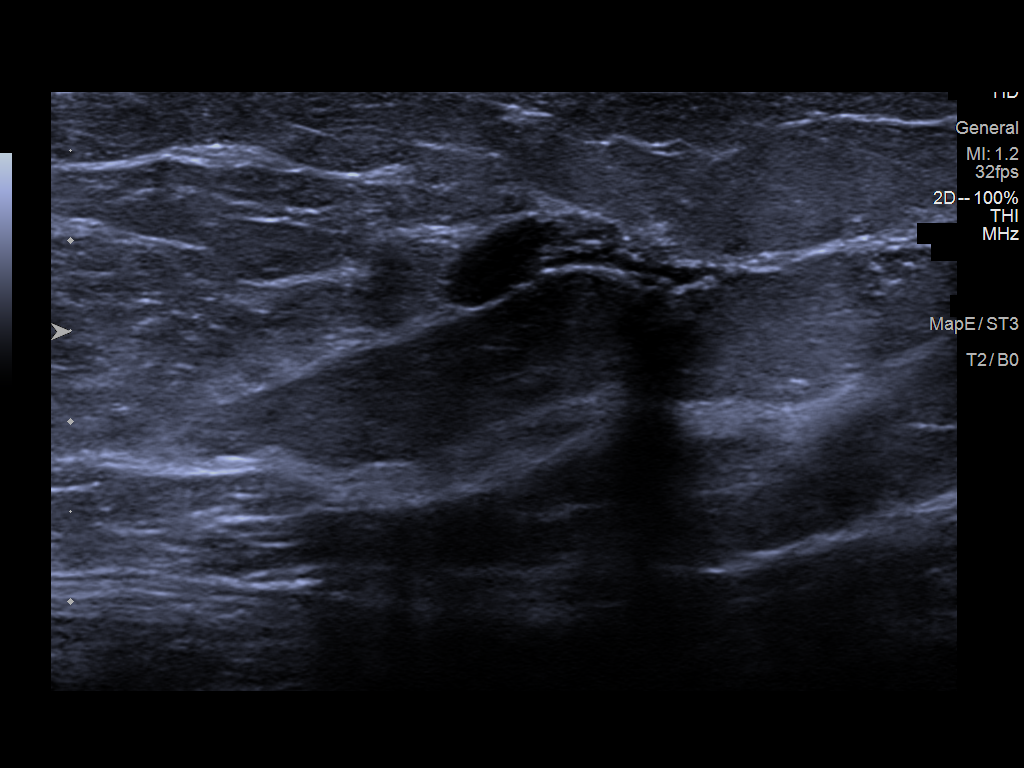
[im 5/6]
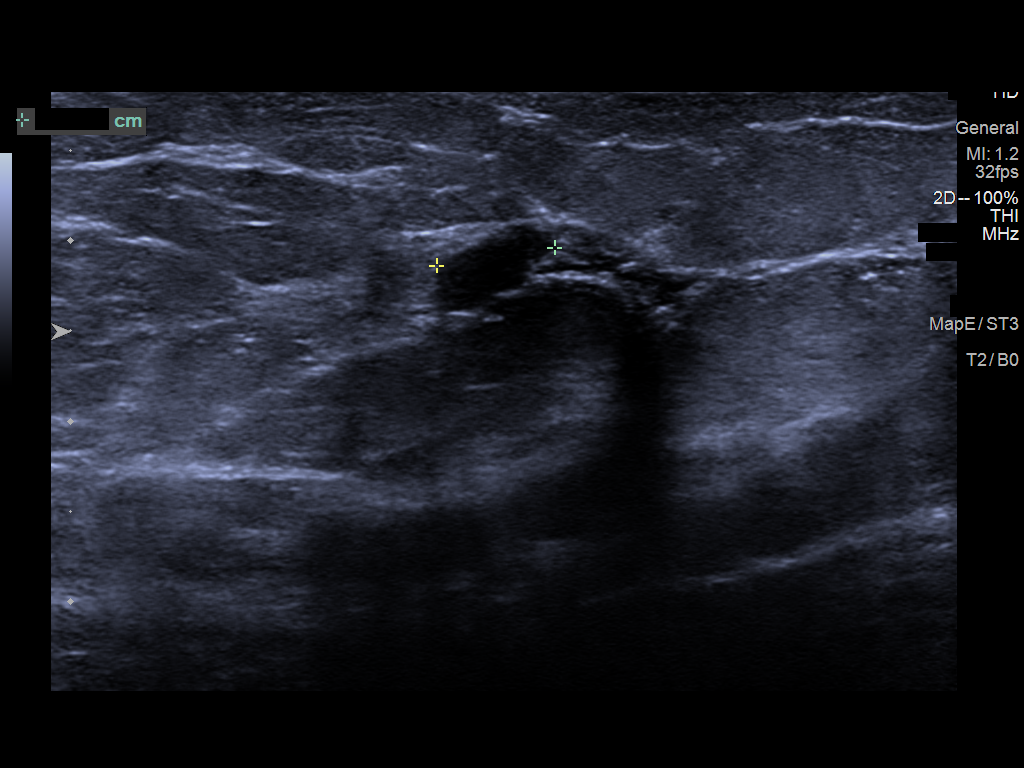
[im 6/6]
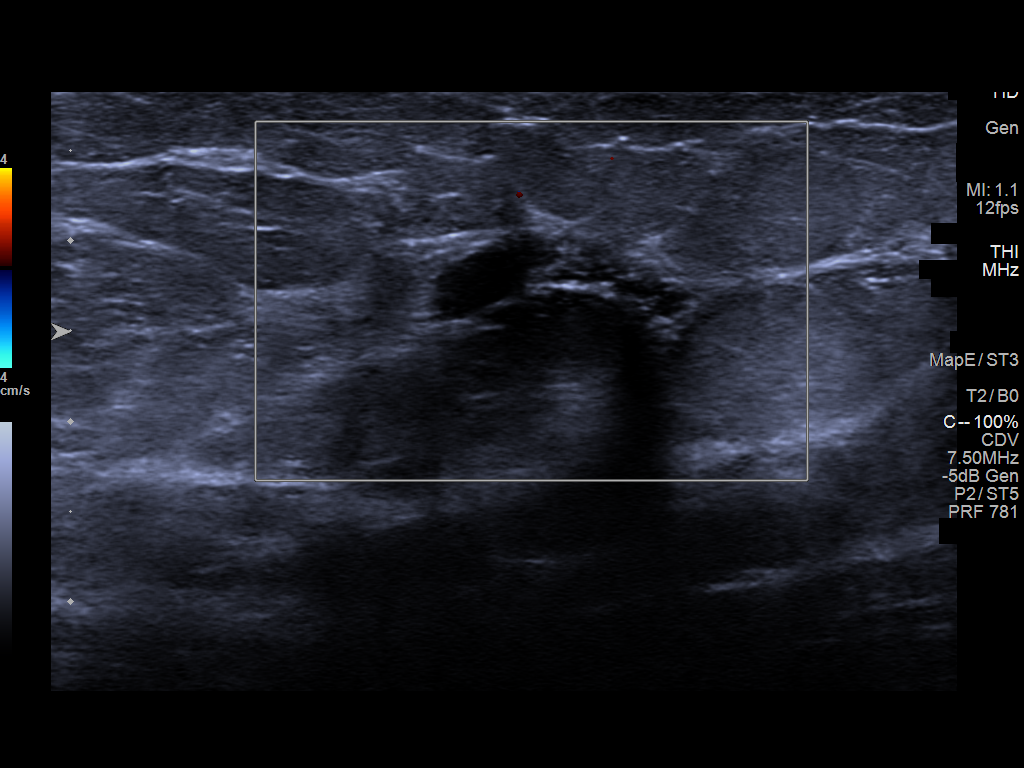

[6 of 6 positions shown; findings below may reference images not displayed]

ACR Breast Density Category b: There are scattered areas of
fibroglandular density.
FINDINGS: Two small, oval, circumscribed masses in the outer right breast are
unchanged. No interval findings suspicious for malignancy.

Mammographic images were processed with CAD.

Targeted ultrasound is performed, showing and 8 x 7 x 5 mm oval,
horizontally oriented, hypoechoic mass with low-level internal
echoes and a partial thin internal septation in the 9 o'clock
position of the right breast, 9 cm from the nipple. This has
predominantly circumscribed margins with some margins at are more
indistinct. No internal blood flow was seen with power Doppler. This
measured 9 x 8 x 4 mm on 02/21/2020.
IMPRESSION: 1. No significant change in a probably benign mass in the 9 o'clock
position of the right breast, most likely representing a complicated
cyst.
2. No evidence of malignancy elsewhere in the right breast.

RECOMMENDATION:
Bilateral diagnostic mammogram and right breast ultrasound in 6
months. That will be 1 year since mammographic evaluation of the
left breast.

I have discussed the findings and recommendations with the patient.
If applicable, a reminder letter will be sent to the patient
regarding the next appointment.

BI-RADS CATEGORY  3: Probably benign.
